# Patient Record
Sex: Male | Born: 1945 | Race: White | Smoking: Never smoker
Health system: Southern US, Community
[De-identification: ages and names within clinical notes are randomized; demographics above are authoritative.]

## PROBLEM LIST (undated history)

## (undated) DIAGNOSIS — Z8719 Personal history of other diseases of the digestive system: Secondary | ICD-10-CM

## (undated) DIAGNOSIS — I10 Essential (primary) hypertension: Secondary | ICD-10-CM

## (undated) HISTORY — PX: ESOPHAGOGASTRODUODENOSCOPY: SHX1529

## (undated) HISTORY — PX: COLONOSCOPY: SHX174

## (undated) HISTORY — PX: APPENDECTOMY: SHX54

## (undated) HISTORY — PX: HERNIA REPAIR: SHX51

## (undated) HISTORY — PX: CARDIAC CATHETERIZATION: SHX172

---

## 2014-03-15 DIAGNOSIS — J329 Chronic sinusitis, unspecified: Secondary | ICD-10-CM | POA: Insufficient documentation

## 2014-03-15 DIAGNOSIS — J339 Nasal polyp, unspecified: Secondary | ICD-10-CM | POA: Insufficient documentation

## 2015-06-19 DIAGNOSIS — L0390 Cellulitis, unspecified: Secondary | ICD-10-CM | POA: Insufficient documentation

## 2015-06-23 DIAGNOSIS — L300 Nummular dermatitis: Secondary | ICD-10-CM | POA: Insufficient documentation

## 2015-06-23 DIAGNOSIS — E785 Hyperlipidemia, unspecified: Secondary | ICD-10-CM | POA: Insufficient documentation

## 2015-07-26 DIAGNOSIS — Z136 Encounter for screening for cardiovascular disorders: Secondary | ICD-10-CM | POA: Insufficient documentation

## 2016-03-11 DIAGNOSIS — I10 Essential (primary) hypertension: Secondary | ICD-10-CM | POA: Insufficient documentation

## 2016-03-11 DIAGNOSIS — R42 Dizziness and giddiness: Secondary | ICD-10-CM | POA: Insufficient documentation

## 2016-04-10 DIAGNOSIS — E875 Hyperkalemia: Secondary | ICD-10-CM | POA: Insufficient documentation

## 2017-01-06 DIAGNOSIS — Z9889 Other specified postprocedural states: Secondary | ICD-10-CM | POA: Insufficient documentation

## 2017-04-22 DIAGNOSIS — I493 Ventricular premature depolarization: Secondary | ICD-10-CM | POA: Insufficient documentation

## 2017-04-22 DIAGNOSIS — M25472 Effusion, left ankle: Secondary | ICD-10-CM | POA: Insufficient documentation

## 2018-02-03 DIAGNOSIS — N528 Other male erectile dysfunction: Secondary | ICD-10-CM | POA: Insufficient documentation

## 2018-02-03 DIAGNOSIS — R202 Paresthesia of skin: Secondary | ICD-10-CM | POA: Insufficient documentation

## 2018-02-03 DIAGNOSIS — R200 Anesthesia of skin: Secondary | ICD-10-CM | POA: Insufficient documentation

## 2018-07-21 ENCOUNTER — Ambulatory Visit
Admission: RE | Admit: 2018-07-21 | Discharge: 2018-07-21 | Disposition: A | Discharge status: Home / Self Care | Payer: Medicare HMO | Source: Ambulatory Visit | Attending: Chiropractic Medicine | Admitting: Chiropractic Medicine

## 2018-07-21 ENCOUNTER — Other Ambulatory Visit: Payer: Self-pay | Admitting: Chiropractic Medicine

## 2018-07-21 DIAGNOSIS — R200 Anesthesia of skin: Secondary | ICD-10-CM

## 2018-07-21 DIAGNOSIS — F909 Attention-deficit hyperactivity disorder, unspecified type: Secondary | ICD-10-CM

## 2018-07-21 DIAGNOSIS — F82 Specific developmental disorder of motor function: Secondary | ICD-10-CM

## 2019-02-11 DIAGNOSIS — F419 Anxiety disorder, unspecified: Secondary | ICD-10-CM | POA: Insufficient documentation

## 2019-05-14 DIAGNOSIS — G4709 Other insomnia: Secondary | ICD-10-CM | POA: Insufficient documentation

## 2019-12-16 DIAGNOSIS — R7301 Impaired fasting glucose: Secondary | ICD-10-CM | POA: Insufficient documentation

## 2020-02-10 IMAGING — CR DG LUMBAR SPINE 2-3V
2 series · 2 of 2 positions shown · non-contrast
Comparison: None.

CLINICAL DATA: Numbness and deficits of the L5-S1 dermatome.

EXAM:
LUMBAR SPINE - 2-3 VIEW

[w lumbar spine ap]
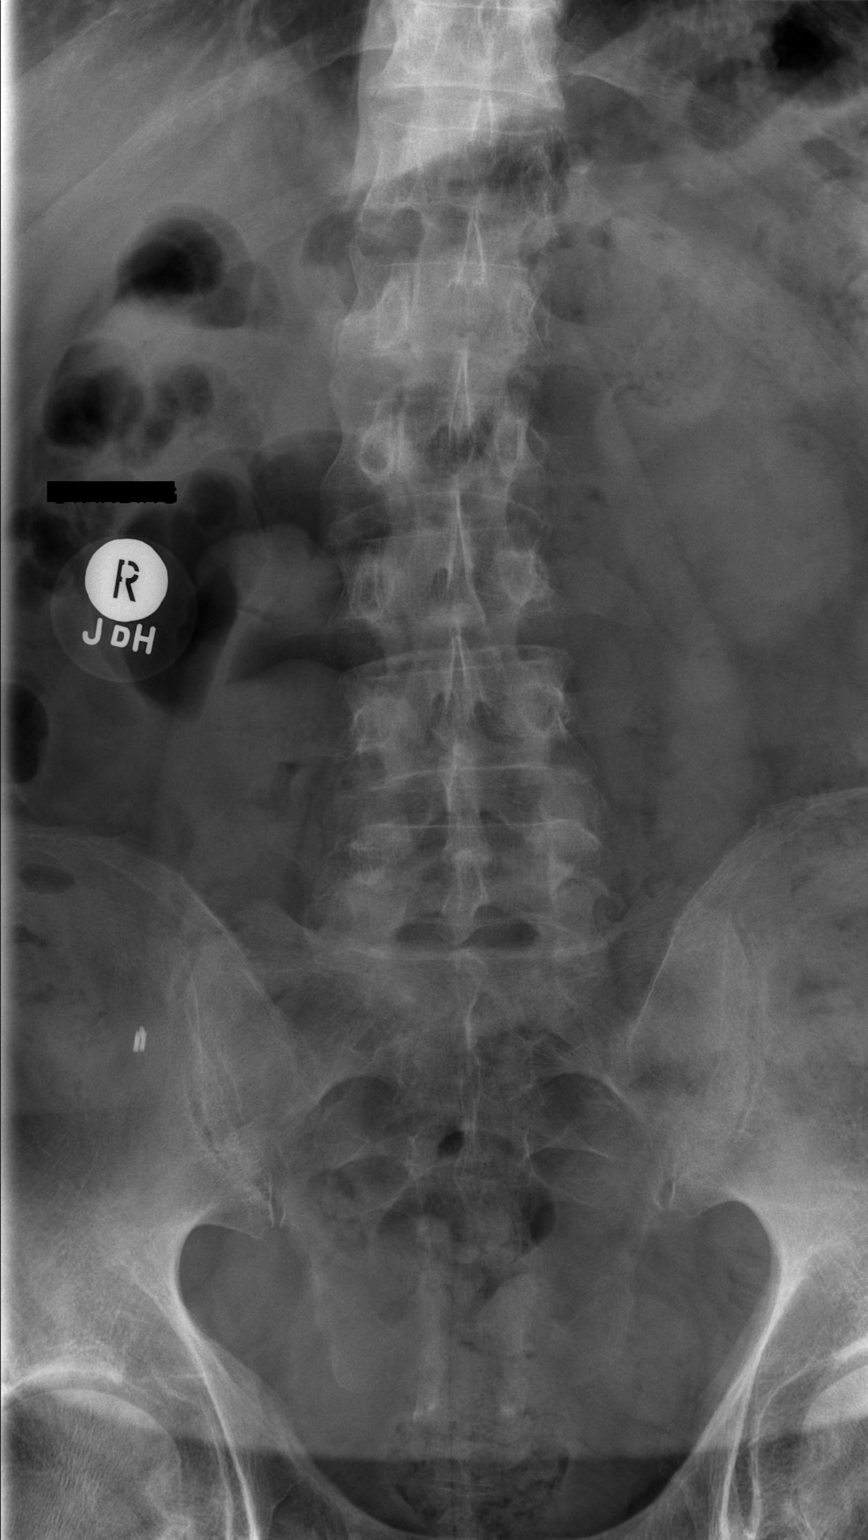

[w lumbar spine lat]
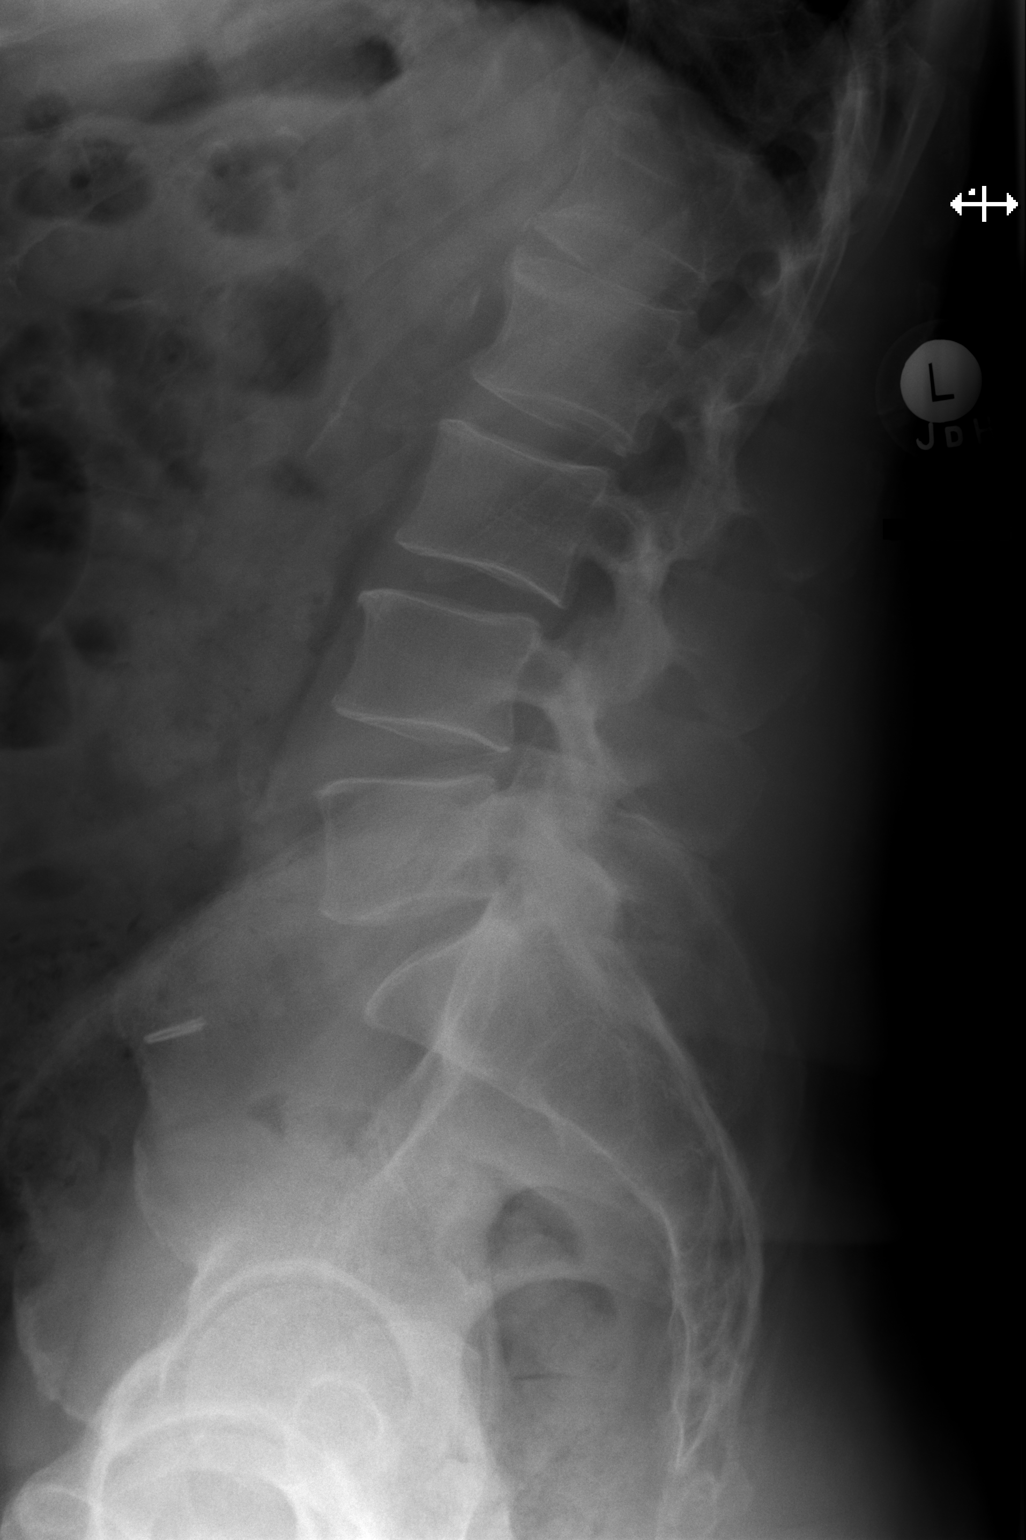

[2 of 2 positions shown; findings below may reference images not displayed]

FINDINGS: There is no acute fracture. Chronic compression deformity of L1 is
noted. There is dextrocurvature of the upper lumbar spine.
Intervertebral disc spaces are maintained.
IMPRESSION: No acute abnormality identified. Dextrocurvature of the upper lumbar
spine.

## 2020-03-23 DIAGNOSIS — R0683 Snoring: Secondary | ICD-10-CM | POA: Insufficient documentation

## 2020-03-23 DIAGNOSIS — R400 Somnolence: Secondary | ICD-10-CM | POA: Insufficient documentation

## 2020-04-17 ENCOUNTER — Other Ambulatory Visit: Payer: Self-pay

## 2020-04-17 ENCOUNTER — Telehealth (INDEPENDENT_AMBULATORY_CARE_PROVIDER_SITE_OTHER): Payer: Self-pay | Admitting: Gastroenterology

## 2020-04-17 DIAGNOSIS — Z86010 Personal history of colonic polyps: Secondary | ICD-10-CM

## 2020-04-17 MED ORDER — NA SULFATE-K SULFATE-MG SULF 17.5-3.13-1.6 GM/177ML PO SOLN: 1.0000 | Freq: Once | ORAL | 0 refills | Status: AC

## 2020-04-17 NOTE — Progress Notes (Signed)
Gastroenterology Pre-Procedure Review  Request Date: Wed 05/10/20 Requesting Physician: Dr. Marius Ditch  PATIENT REVIEW QUESTIONS: The patient responded to the following health history questions as indicated:    1. Are you having any GI issues? some diarrhea has improved since eliminated creamer.  Back to normal 2. Do you have a personal history of Polyps? yes (03/06/17) 3. Do you have a family history of Colon Cancer or Polyps? no 4. Diabetes Mellitus? no 5. Joint replacements in the past 12 months?no 6. Major health problems in the past 3 months?no 7. Any artificial heart valves, MVP, or defibrillator?no    MEDICATIONS & ALLERGIES:    Patient reports the following regarding taking any anticoagulation/antiplatelet therapy:   Plavix, Coumadin, Eliquis, Xarelto, Lovenox, Pradaxa, Brilinta, or Effient? no Aspirin? 81 mg  Patient confirms/reports the following medications:  Current Outpatient Medications  Medication Sig Dispense Refill  . aspirin 81 MG EC tablet Take by mouth.    Marland Kitchen Bioflavonoid Products (ESTER-C) TABS Take by mouth.    . fluticasone (FLONASE) 50 MCG/ACT nasal spray Place into the nose as needed.    Marland Kitchen losartan (COZAAR) 100 MG tablet losartan 100 mg tablet  TAKE 1 TABLET BY MOUTH EVERY DAY    . Multiple Vitamin (MULTIVITAMIN) capsule Take 1 capsule by mouth daily.     No current facility-administered medications for this visit.    Patient confirms/reports the following allergies:  Allergies  Allergen Reactions  . No Known Allergies     No orders of the defined types were placed in this encounter.   AUTHORIZATION INFORMATION Primary Insurance: 1D#: Group #:  Secondary Insurance: 1D#: Group #:  SCHEDULE INFORMATION: Date: 05/10/20 Time: Location:[facility]

## 2020-05-08 ENCOUNTER — Other Ambulatory Visit: Payer: Medicare HMO | Attending: Gastroenterology

## 2020-05-10 ENCOUNTER — Ambulatory Visit: Admission: RE | Admit: 2020-05-10 | Payer: Medicare HMO | Source: Home / Self Care | Admitting: Gastroenterology

## 2020-05-10 ENCOUNTER — Encounter: Admission: RE | Payer: Self-pay | Source: Home / Self Care

## 2020-05-10 SURGERY — COLONOSCOPY WITH PROPOFOL
Anesthesia: General

## 2020-08-18 ENCOUNTER — Telehealth: Payer: Medicare HMO

## 2020-08-18 ENCOUNTER — Other Ambulatory Visit: Payer: Self-pay

## 2020-08-18 ENCOUNTER — Telehealth: Payer: Self-pay

## 2020-08-18 DIAGNOSIS — Z86010 Personal history of colonic polyps: Secondary | ICD-10-CM

## 2020-08-18 NOTE — Telephone Encounter (Signed)
Patients colonoscopy has been rescheduled to Wednesday 09/13/20 with Dr. Marius Ditch at [facility].  Patient has been advised of COVID testing Monday 09/11/20.  Instructions will be mailed with his new appt date.  New order has been placed.  Thanks,  [place], [place]

## 2020-09-05 ENCOUNTER — Other Ambulatory Visit: Payer: Self-pay

## 2020-09-05 DIAGNOSIS — Z86010 Personal history of colonic polyps: Secondary | ICD-10-CM

## 2020-09-11 ENCOUNTER — Other Ambulatory Visit: Payer: Self-pay

## 2020-09-11 ENCOUNTER — Other Ambulatory Visit
Admission: RE | Admit: 2020-09-11 | Discharge: 2020-09-11 | Disposition: A | Discharge status: Home / Self Care | Payer: Medicare HMO | Source: Ambulatory Visit | Attending: Gastroenterology | Admitting: Gastroenterology

## 2020-09-11 DIAGNOSIS — Z20822 Contact with and (suspected) exposure to covid-19: Secondary | ICD-10-CM | POA: Insufficient documentation

## 2020-09-11 DIAGNOSIS — Z01812 Encounter for preprocedural laboratory examination: Secondary | ICD-10-CM | POA: Diagnosis present

## 2020-09-11 LAB — SARS CORONAVIRUS 2 (TAT 6-24 HRS): SARS Coronavirus 2: NEGATIVE

## 2020-09-13 ENCOUNTER — Ambulatory Visit: Payer: Medicare HMO | Admitting: Certified Registered Nurse Anesthetist

## 2020-09-13 ENCOUNTER — Encounter: Payer: Self-pay | Admitting: Gastroenterology

## 2020-09-13 ENCOUNTER — Encounter
Admission: RE | Disposition: A | Discharge status: Home / Self Care | Payer: Self-pay | Source: Home / Self Care | Attending: Gastroenterology

## 2020-09-13 ENCOUNTER — Ambulatory Visit
Admission: RE | Admit: 2020-09-13 | Discharge: 2020-09-13 | Disposition: A | Discharge status: Home / Self Care | Payer: Medicare HMO | Attending: Gastroenterology | Admitting: Gastroenterology

## 2020-09-13 DIAGNOSIS — Z7982 Long term (current) use of aspirin: Secondary | ICD-10-CM | POA: Diagnosis not present

## 2020-09-13 DIAGNOSIS — K635 Polyp of colon: Secondary | ICD-10-CM | POA: Diagnosis not present

## 2020-09-13 DIAGNOSIS — Z79899 Other long term (current) drug therapy: Secondary | ICD-10-CM | POA: Diagnosis not present

## 2020-09-13 DIAGNOSIS — D122 Benign neoplasm of ascending colon: Secondary | ICD-10-CM | POA: Insufficient documentation

## 2020-09-13 DIAGNOSIS — Z1211 Encounter for screening for malignant neoplasm of colon: Secondary | ICD-10-CM | POA: Diagnosis present

## 2020-09-13 DIAGNOSIS — D120 Benign neoplasm of cecum: Secondary | ICD-10-CM | POA: Diagnosis not present

## 2020-09-13 DIAGNOSIS — Z86010 Personal history of colonic polyps: Secondary | ICD-10-CM

## 2020-09-13 HISTORY — DX: Personal history of other diseases of the digestive system: Z87.19

## 2020-09-13 HISTORY — DX: Essential (primary) hypertension: I10

## 2020-09-13 HISTORY — PX: COLONOSCOPY WITH PROPOFOL: SHX5780

## 2020-09-13 SURGERY — COLONOSCOPY WITH PROPOFOL
Anesthesia: General

## 2020-09-13 MED ORDER — SODIUM CHLORIDE 0.9 % IV SOLN: INTRAVENOUS | Status: DC

## 2020-09-13 MED ORDER — PROPOFOL 10 MG/ML IV BOLUS
INTRAVENOUS | Status: DC | PRN
  Administered 2020-09-13: 80 mg via INTRAVENOUS

## 2020-09-13 MED ORDER — LIDOCAINE HCL (CARDIAC) PF 100 MG/5ML IV SOSY
PREFILLED_SYRINGE | INTRAVENOUS | Status: DC | PRN
  Administered 2020-09-13: 50 mg via INTRAVENOUS

## 2020-09-13 MED ORDER — PROPOFOL 500 MG/50ML IV EMUL
INTRAVENOUS | Status: AC
  Filled 2020-09-13: qty 50

## 2020-09-13 MED ORDER — PROPOFOL 500 MG/50ML IV EMUL
INTRAVENOUS | Status: DC | PRN
  Administered 2020-09-13: 175 ug/kg/min via INTRAVENOUS

## 2020-09-13 MED ORDER — LIDOCAINE HCL (PF) 2 % IJ SOLN
INTRAMUSCULAR | Status: AC
  Filled 2020-09-13: qty 5

## 2020-09-13 NOTE — Anesthesia Procedure Notes (Signed)
Date/Time: 09/13/2020 9:12 AM Performed by: Johnna Acosta, CRNA Pre-anesthesia Checklist: Patient identified, Emergency Drugs available, Suction available, Patient being monitored and Timeout performed Patient Re-evaluated:Patient Re-evaluated prior to induction Oxygen Delivery Method: Nasal cannula Preoxygenation: Pre-oxygenation with 100% oxygen Induction Type: IV induction

## 2020-09-13 NOTE — Op Note (Signed)
[facility] Gastroenterology Patient Name: Javier Baxter Procedure Date: 09/13/2020 9:03 AM MRN: [id number] Account #: [id number] Date of Birth: [birth year] Admit Type: Outpatient Age: 75 Room: [facility] ENDO ROOM 4 Gender: Male Note Status: Finalized Procedure:             Colonoscopy Indications:           Surveillance: Personal history of adenomatous polyps                         on last colonoscopy 3 years ago Providers:             Lin Landsman MD, MD Medicines:             General Anesthesia Complications:         No immediate complications. Estimated blood loss:                         Minimal. Procedure:             Pre-Anesthesia Assessment:                        - Prior to the procedure, a History and Physical was                         performed, and patient medications and allergies were                         reviewed. The patient is competent. The risks and                         benefits of the procedure and the sedation options and                         risks were discussed with the patient. All questions                         were answered and informed consent was obtained.                         Patient identification and proposed procedure were                         verified by the physician, the nurse, the                         anesthesiologist, the anesthetist and the technician                         in the pre-procedure area in the procedure room in the                         endoscopy suite. Mental Status Examination: alert and                         oriented. Airway Examination: normal oropharyngeal                         airway and neck mobility. Respiratory Examination:  clear to auscultation. CV Examination: normal.                         Prophylactic Antibiotics: The patient does not require                         prophylactic antibiotics. Prior Anticoagulants: The                          patient has taken no previous anticoagulant or                         antiplatelet agents. ASA Grade Assessment: II - A                         patient with mild systemic disease. After reviewing                         the risks and benefits, the patient was deemed in                         satisfactory condition to undergo the procedure. The                         anesthesia plan was to use general anesthesia.                         Immediately prior to administration of medications,                         the patient was re-assessed for adequacy to receive                         sedatives. The heart rate, respiratory rate, oxygen                         saturations, blood pressure, adequacy of pulmonary                         ventilation, and response to care were monitored                         throughout the procedure. The physical status of the                         patient was re-assessed after the procedure.                        After obtaining informed consent, the colonoscope was                         passed under direct vision. Throughout the procedure,                         the patient's blood pressure, pulse, and oxygen                         saturations were monitored continuously. The  Colonoscope was introduced through the anus and                         advanced to the the terminal ileum, with                         identification of the appendiceal orifice and IC                         valve. The colonoscopy was performed without                         difficulty. The patient tolerated the procedure well.                         The quality of the bowel preparation was evaluated                         using the BBPS [facility] Bowel Preparation Scale) with                         scores of: Right Colon = 3, Transverse Colon = 3 and                         Left Colon = 3 (entire mucosa seen well with no                          residual staining, small fragments of stool or opaque                         liquid). The total BBPS score equals 9. Findings:      The perianal and digital rectal examinations were normal. Pertinent       negatives include normal sphincter tone and no palpable rectal lesions.      The terminal ileum appeared normal.      Two sessile polyps were found in the ascending colon and cecum. The       polyps were diminutive in size. These polyps were removed with a cold       biopsy forceps. Resection and retrieval were complete. To prevent       bleeding after the biopsy, one hemostatic clip was successfully placed.       There was no bleeding at the end of the procedure.      The retroflexed view of the distal rectum and anal verge was normal and       showed no anal or rectal abnormalities.      The exam was otherwise without abnormality. Impression:            - The examined portion of the ileum was normal.                        - Two diminutive polyps in the ascending colon and in                         the cecum, removed with a cold biopsy forceps.  Resected and retrieved. Clip was placed.                        - The distal rectum and anal verge are normal on                         retroflexion view.                        - The examination was otherwise normal. Recommendation:        - Discharge patient to home (with escort).                        - Resume previous diet today.                        - Continue present medications.                        - Await pathology results.                        - Repeat colonoscopy in 5 years for surveillance. Procedure Code(s):     --- Professional ---                        [phone], Colonoscopy, flexible; with biopsy, single or                         multiple Diagnosis Code(s):     --- Professional ---                        Z86.010, Personal history of colonic polyps                        K63.5, Polyp of  colon CPT copyright 2019 American Medical Association. All rights reserved. The codes documented in this report are preliminary and upon coder review may  be revised to meet current compliance requirements. Dr. Ulyess Mort Lin Landsman MD, MD 09/13/2020 9:40:11 AM This report has been signed electronically. Number of Addenda: 0 Note Initiated On: 09/13/2020 9:03 AM Scope Withdrawal Time: 0 hours 16 minutes 32 seconds  Total Procedure Duration: 0 hours 20 minutes 4 seconds  Estimated Blood Loss:  Estimated blood loss was minimal.      [facility]

## 2020-09-13 NOTE — Anesthesia Postprocedure Evaluation (Signed)
Anesthesia Post Note  Patient: Xavian Hardcastle  Procedure(s) Performed: COLONOSCOPY WITH PROPOFOL (N/A )  Patient location during evaluation: PACU Anesthesia Type: General Level of consciousness: awake and alert Pain management: pain level controlled Vital Signs Assessment: post-procedure vital signs reviewed and stable Respiratory status: spontaneous breathing, nonlabored ventilation, respiratory function stable and patient connected to nasal cannula oxygen Cardiovascular status: blood pressure returned to baseline and stable Postop Assessment: no apparent nausea or vomiting Anesthetic complications: no   No complications documented.   Last Vitals:  Vitals:   09/13/20 1003 09/13/20 1013  BP: 128/80 139/87  Pulse: (!) 56 (!) 57  Resp: 15 (!) 21  Temp:    SpO2: 98% 98%    Last Pain:  Vitals:   09/13/20 1013  TempSrc:   PainSc: 0-No pain                 Molli Barrows

## 2020-09-13 NOTE — Transfer of Care (Signed)
Immediate Anesthesia Transfer of Care Note  Patient: Javier Baxter  Procedure(s) Performed: COLONOSCOPY WITH PROPOFOL (N/A )  Patient Location: PACU  Anesthesia Type:General  Level of Consciousness: sedated  Airway & Oxygen Therapy: Patient Spontanous Breathing  Post-op Assessment: Report given to RN and Post -op Vital signs reviewed and stable  Post vital signs: Reviewed and stable  Last Vitals:  Vitals Value Taken Time  BP 116/69 09/13/20 0943  Temp    Pulse 58 09/13/20 0944  Resp 16 09/13/20 0944  SpO2 97 % 09/13/20 0944  Vitals shown include unvalidated device data.  Last Pain:  Vitals:   09/13/20 0753  TempSrc: Temporal  PainSc: 0-No pain         Complications: No complications documented.

## 2020-09-13 NOTE — H&P (Signed)
Javier Darby, MD [address]  [place]  [place], [place] [address]  Main: [phone]  Fax: [phone] Pager: [phone]  Primary Care Physician:  Benjiman Core, DO Primary Gastroenterologist:  Dr. Cephas Baxter  Pre-Procedure History & Physical: HPI:  Javier Baxter is a 75 y.o. male is here for an colonoscopy.   Past Medical History:  Diagnosis Date  . History of hiatal hernia   . Hypertension     Past Surgical History:  Procedure Laterality Date  . APPENDECTOMY    . CARDIAC CATHETERIZATION    . COLONOSCOPY    . ESOPHAGOGASTRODUODENOSCOPY    . HERNIA REPAIR      Prior to Admission medications   Medication Sig Start Date End Date Taking? Authorizing Provider  fluticasone (FLONASE) 50 MCG/ACT nasal spray Place into the nose as needed.   Yes [provider]  aspirin 81 MG EC tablet Take by mouth.    [provider]  Bioflavonoid Products (ESTER-C) TABS Take by mouth.    [provider]  losartan (COZAAR) 100 MG tablet losartan 100 mg tablet  TAKE 1 TABLET BY MOUTH EVERY DAY 12/16/19   [provider]  Multiple Vitamin (MULTIVITAMIN) capsule Take 1 capsule by mouth daily.    [provider]    Allergies as of 08/18/2020 - Review Complete 04/17/2020  Allergen Reaction Noted  . No known allergies  04/17/2020    History reviewed. No pertinent family history.  Social History   Socioeconomic History  . Marital status: Divorced    Spouse name: Not on file  . Number of children: Not on file  . Years of education: Not on file  . Highest education level: Not on file  Occupational History  . Not on file  Tobacco Use  . Smoking status: Never Smoker  . Smokeless tobacco: Never Used  Vaping Use  . Vaping Use: Never used  Substance and Sexual Activity  . Alcohol use: Yes    Alcohol/week: 2.0 standard drinks    Types: 2 Glasses of wine per week  . Drug use: Never  . Sexual activity: Not on file  Other  Topics Concern  . Not on file  Social History Narrative  . Not on file   Social Determinants of Health   Financial Resource Strain: Not on file  Food Insecurity: Not on file  Transportation Needs: Not on file  Physical Activity: Not on file  Stress: Not on file  Social Connections: Not on file  Intimate Partner Violence: Not on file    Review of Systems: See HPI, otherwise negative ROS  Physical Exam: BP 135/78   Pulse 76   Temp (!) 96.7 F (35.9 C) (Temporal)   Resp 18   Ht 6\' 1"  (1.854 m)   Wt 102.1 kg   SpO2 100%   BMI 29.69 kg/m  General:   Alert,  pleasant and cooperative in NAD Head:  Normocephalic and atraumatic. Neck:  Supple; no masses or thyromegaly. Lungs:  Clear throughout to auscultation.    Heart:  Regular rate and rhythm. Abdomen:  Soft, nontender and nondistended. Normal bowel sounds, without guarding, and without rebound.   Neurologic:  Alert and  oriented x4;  grossly normal neurologically.  Impression/Plan: Javier Baxter is here for an colonoscopy to be performed for personal history of colon polyps  Risks, benefits, limitations, and alternatives regarding  colonoscopy have been reviewed with the patient.  Questions have been answered.  All parties agreeable.   Rohini Vanga,  MD  09/13/2020, 8:14 AM

## 2020-09-13 NOTE — Anesthesia Preprocedure Evaluation (Signed)
Anesthesia Evaluation  Patient identified by MRN, date of birth, ID band Patient awake    Reviewed: Allergy & Precautions, H&P , NPO status , Patient's Chart, lab work & pertinent test results, reviewed documented beta blocker date and time   Airway Mallampati: II   Neck ROM: full    Dental  (+) Poor Dentition   Pulmonary neg pulmonary ROS,    Pulmonary exam normal        Cardiovascular Exercise Tolerance: Good hypertension, On Medications negative cardio ROS Normal cardiovascular exam Rhythm:regular Rate:Normal     Neuro/Psych Anxiety negative neurological ROS  negative psych ROS   GI/Hepatic Neg liver ROS, hiatal hernia,   Endo/Other  negative endocrine ROS  Renal/GU negative Renal ROS  negative genitourinary   Musculoskeletal   Abdominal   Peds  Hematology negative hematology ROS (+)   Anesthesia Other Findings Past Medical History: No date: History of hiatal hernia No date: Hypertension Past Surgical History: No date: APPENDECTOMY No date: CARDIAC CATHETERIZATION No date: COLONOSCOPY No date: ESOPHAGOGASTRODUODENOSCOPY No date: HERNIA REPAIR BMI    Body Mass Index: 29.69 kg/m     Reproductive/Obstetrics negative OB ROS                             Anesthesia Physical Anesthesia Plan  ASA: II  Anesthesia Plan: General   Post-op Pain Management:    Induction:   PONV Risk Score and Plan:   Airway Management Planned:   Additional Equipment:   Intra-op Plan:   Post-operative Plan:   Informed Consent: I have reviewed the patients History and Physical, chart, labs and discussed the procedure including the risks, benefits and alternatives for the proposed anesthesia with the patient or authorized representative who has indicated his/her understanding and acceptance.     Dental Advisory Given  Plan Discussed with: CRNA  Anesthesia Plan Comments:          Anesthesia Quick Evaluation

## 2020-09-14 ENCOUNTER — Encounter: Payer: Self-pay | Admitting: Gastroenterology

## 2020-09-14 LAB — SURGICAL PATHOLOGY

## 2020-12-25 ENCOUNTER — Encounter: Payer: Self-pay | Admitting: Urology

## 2020-12-25 ENCOUNTER — Other Ambulatory Visit: Payer: Self-pay

## 2020-12-25 ENCOUNTER — Ambulatory Visit: Payer: Medicare HMO | Admitting: Urology

## 2020-12-25 VITALS — BP 141/81 | HR 74 | Ht 73.0 in | Wt 202.0 lb

## 2020-12-25 DIAGNOSIS — N5201 Erectile dysfunction due to arterial insufficiency: Secondary | ICD-10-CM | POA: Diagnosis not present

## 2020-12-25 MED ORDER — STENDRA 200 MG PO TABS: ORAL_TABLET | ORAL | 0 refills | Status: AC

## 2020-12-25 NOTE — Progress Notes (Signed)
12/25/2020 1:03 PM   Javier Baxter 08-19-1945 [id number]  Referring provider: Benjiman Core, DO [place] [address],  [place] [address]  Chief Complaint  Patient presents with   Erectile Dysfunction    HPI: Javier Baxter is a 75 y.o. male self-referred for erectile dysfunction.  He has been single for 12 years and is dating someone who he eventually will be sexually active with History of erectile dysfunction with difficulty achieving an erection Is able to achieve relatively firm erections with early morning awakening but still not as firm as several years ago Has tried both sildenafil and tadalafil but could not tolerate secondary to side effects Organic risk factors include hypertension, antihypertensive medications.  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 He is a non-smoker with no previous tobacco history   PMH: Past Medical History:  Diagnosis Date   History of hiatal hernia    Hypertension     Surgical History: Past Surgical History:  Procedure Laterality Date   APPENDECTOMY     CARDIAC CATHETERIZATION     COLONOSCOPY     COLONOSCOPY WITH PROPOFOL N/A 09/13/2020   Procedure: COLONOSCOPY WITH PROPOFOL;  Surgeon: Lin Landsman, MD;  Location: [facility] ENDOSCOPY;  Service: Gastroenterology;  Laterality: N/A;   ESOPHAGOGASTRODUODENOSCOPY     HERNIA REPAIR      Home Medications:  Allergies as of 12/25/2020       Reactions   No Known Allergies         Medication List        Accurate as of December 25, 2020  1:03 PM. If you have any questions, ask your nurse or doctor.          aspirin 81 MG EC tablet Take by mouth.   Ester-C Tabs Take by mouth.   fluticasone 50 MCG/ACT nasal spray Commonly known as: FLONASE Place into the nose as needed.   losartan 100 MG tablet Commonly known as: COZAAR losartan 100 mg tablet  TAKE 1 TABLET BY MOUTH EVERY DAY   multivitamin capsule Take 1 capsule by mouth  daily.        Allergies:  Allergies  Allergen Reactions   No Known Allergies     Family History: History reviewed. No pertinent family history.  Social History:  reports that he has never smoked. He has never used smokeless tobacco. He reports current alcohol use of about 2.0 standard drinks of alcohol per week. He reports that he does not use drugs.   Physical Exam: BP (!) 141/81   Pulse 74   Ht 6\' 1"  (1.854 m)   Wt 202 lb (91.6 kg)   BMI 26.65 kg/m   Constitutional:  Alert, No acute distress. HEENT: [place] AT, moist mucus membranes.  Trachea midline, no masses. Cardiovascular: No clubbing, cyanosis, or edema. Respiratory: Normal respiratory effort, no increased work of breathing. GU: Phallus circumcised without palpable plaques.  Testes descended bilaterally without masses or tenderness, normal size.  No hernia Skin: No rashes, bruises or suspicious lesions. Neurologic: Grossly intact, no focal deficits, moving all 4 extremities. Psychiatric: Normal mood and affect.   Assessment & Plan:    1.  Erectile dysfunction We discussed that ~ 70% of organic ED is related to arterial insufficiency and he does have organic risk factors that would affect arterial blood flow Unable to tolerate tadalafil/sildenafil secondary to side effects We discussed intracavernosal injections and shockwave therapy He had done some research on Stendra and the possibility  of lower side effects with this medication He was initially interested in a trial of this medication and he was given a printed Rx He was also given literature on intracavernosal injections He will follow-up prn  I spent 45 total minutes on the day of the encounter including pre-visit review of the medical record, face-to-face time with the patient, and post visit ordering of labs/imaging/tests.   Abbie Sons, [place] [address] [place], [place] [address] [phone]

## 2023-04-01 ENCOUNTER — Other Ambulatory Visit: Payer: Self-pay

## 2023-04-01 ENCOUNTER — Emergency Department: Payer: Medicare HMO

## 2023-04-01 ENCOUNTER — Emergency Department
Admission: EM | Admit: 2023-04-01 | Discharge: 2023-04-01 | Disposition: A | Discharge status: Home / Self Care | Payer: Medicare HMO | Attending: Emergency Medicine | Admitting: Emergency Medicine

## 2023-04-01 DIAGNOSIS — S61211A Laceration without foreign body of left index finger without damage to nail, initial encounter: Secondary | ICD-10-CM | POA: Insufficient documentation

## 2023-04-01 DIAGNOSIS — W312XXA Contact with powered woodworking and forming machines, initial encounter: Secondary | ICD-10-CM | POA: Diagnosis not present

## 2023-04-01 DIAGNOSIS — I10 Essential (primary) hypertension: Secondary | ICD-10-CM | POA: Insufficient documentation

## 2023-04-01 DIAGNOSIS — S6992XA Unspecified injury of left wrist, hand and finger(s), initial encounter: Secondary | ICD-10-CM | POA: Diagnosis present

## 2023-04-01 MED ORDER — CEPHALEXIN 500 MG PO CAPS
500.0000 mg | ORAL_CAPSULE | Freq: Three times a day (TID) | ORAL | 0 refills | Status: AC
Start: 2023-04-01 — End: 2023-04-08

## 2023-04-01 MED ORDER — LIDOCAINE HCL (PF) 1 % IJ SOLN
5.0000 mL | Freq: Once | INTRAMUSCULAR | Status: AC
  Administered 2023-04-01: 5 mL via INTRADERMAL
  Filled 2023-04-01: qty 5

## 2023-04-01 NOTE — ED Notes (Signed)
Pt d/c home per MD order. Discharge summary reviewed, pt verbalizes understanding. Ambulatory off unit. No s/s of acute distress noted.  

## 2023-04-01 NOTE — ED Provider Notes (Signed)
[facility] Provider Note    Event Date/Time   First MD Initiated Contact with Patient 04/01/23 1122     (approximate)   History   Hand Injury   HPI  Javier Baxter is a 77 y.o. male with history of hypertension presents emergency department with laceration to the left index finger.  Patient was working out his irrigation as one of the heads is trapped under grass.  Was using a circular type cell.  Wound he went to grab something his finger slipped and hit the trigger causing a laceration on the left index finger.  Patient has neuropathy so he has no new numbness or tingling.  States he does have full range of motion.  States his tetanus is up-to-date.      Physical Exam   Triage Vital Signs: ED Triage Vitals [04/01/23 1054]  Encounter Vitals Group     BP (!) 157/127     Systolic BP Percentile      Diastolic BP Percentile      Pulse Rate 93     Resp 18     Temp 98.3 F (36.8 C)     Temp Source Oral     SpO2 95 %     Weight 208 lb (94.3 kg)     Height 6\' 1"  (1.854 m)     Head Circumference      Peak Flow      Pain Score 0     Pain Loc      Pain Education      Exclude from Growth Chart     Most recent vital signs: Vitals:   04/01/23 1054  BP: (!) 157/127  Pulse: 93  Resp: 18  Temp: 98.3 F (36.8 C)  SpO2: 95%     General: Awake, no distress.   CV:  Good peripheral perfusion. regular rate and  rhythm Resp:  Normal effort.  Abd:  No distention.   Other:  Left hand and arm are covered in dirt, laceration noted at the base of the left index finger, dirt is embedded in the wound, neurovascular intact, full range of motion   ED Results / Procedures / Treatments   Labs (all labs ordered are listed, but only abnormal results are displayed) Labs Reviewed - No data to display   EKG     RADIOLOGY X-ray left index finger    PROCEDURES:   .Marland KitchenLaceration Repair  Date/Time: 04/01/2023 12:53 PM  Performed by: Faythe Ghee,  PA-C Authorized by: Faythe Ghee, PA-C   Consent:    Consent obtained:  Verbal   Consent given by:  Patient   Risks, benefits, and alternatives were discussed: yes     Risks discussed:  Infection, pain, retained foreign body, tendon damage, poor cosmetic result, need for additional repair, nerve damage, poor wound healing and vascular damage   Alternatives discussed:  No treatment Universal protocol:    Procedure explained and questions answered to patient or proxy's satisfaction: yes     Immediately prior to procedure, a time out was called: yes     Patient identity confirmed:  Verbally with patient Anesthesia:    Anesthesia method:  Local infiltration   Local anesthetic:  Lidocaine 1% w/o epi Laceration details:    Location:  Finger   Finger location:  L index finger   Length (cm):  2.5 Pre-procedure details:    Preparation:  Patient was prepped and draped in usual sterile fashion and imaging obtained to evaluate for  foreign bodies Exploration:    Limited defect created (wound extended): no     Hemostasis achieved with:  Direct pressure   Imaging obtained: x-ray     Imaging outcome: foreign body not noted     Wound exploration: entire depth of wound visualized     Wound extent: areolar tissue not violated, fascia not violated, no foreign body, no signs of injury, no nerve damage, no tendon damage, no underlying fracture and no vascular damage     Contaminated: yes   Treatment:    Area cleansed with:  Povidone-iodine and saline   Amount of cleaning:  Extensive   Irrigation solution:  Sterile saline   Irrigation method:  Pressure wash, syringe and tap Skin repair:    Repair method:  Sutures   Suture size:  5-0   Suture material:  Nylon   Suture technique:  Simple interrupted   Number of sutures:  7 Approximation:    Approximation:  Close Repair type:    Repair type:  Intermediate Post-procedure details:    Dressing:  Non-adherent dressing and splint for protection    Procedure completion:  Tolerated well, no immediate complications    MEDICATIONS ORDERED IN ED: Medications  lidocaine (PF) (XYLOCAINE) 1 % injection 5 mL (5 mLs Intradermal Given by Other 04/01/23 1249)     IMPRESSION / MDM / ASSESSMENT AND PLAN / ED COURSE  I reviewed the triage vital signs and the nursing notes.                              Differential diagnosis includes, but is not limited to, laceration, foreign body, fracture  Patient's presentation is most consistent with acute illness / injury with system symptoms.   X-ray left index finger.  I did independently review and interpret the x-ray readings being negative for any acute abnormality.  No foreign body of any metal origin noted, no fracture of the finger, awaiting radiology read  Laceration repair will be performed by Marlene Bast, PA student under my supervision   See laceration repair procedure.  The wound is very clean.  Looks to be well-approximated.  Will place him on antibiotic due to the amount of dirt that was on his hands and in the wound earlier.  Patient is in agreement treatment plan.  He is given a prescription for Keflex.  He is follow-up with orthopedics.  We did placement finger splint due to the area of the laceration to make sure he does not have any tendon injury.  He is in agreement was discharged stable condition.   FINAL CLINICAL IMPRESSION(S) / ED DIAGNOSES   Final diagnoses:  Laceration of left index finger without foreign body without damage to nail, initial encounter     Rx / DC Orders   ED Discharge Orders          Ordered    cephALEXin (KEFLEX) 500 MG capsule  3 times daily        04/01/23 1252             Note:  This document was prepared using Dragon voice recognition software and may include unintentional dictation errors.    Faythe Ghee, PA-C 04/01/23 1256    Jene Every, MD 04/01/23 [phone]

## 2023-04-01 NOTE — ED Notes (Signed)
X-ray at bedside

## 2023-04-01 NOTE — ED Triage Notes (Signed)
Laceration to left index finger.  Cut with circular saw.  Bleed ing controlled. Open wound to anterior base of index finger.  DSD applied.
